# Patient Record
Sex: Male | Born: 1965 | Race: Black or African American | Hispanic: No | Marital: Single | Smoking: Current every day smoker
Health system: Southern US, Community
[De-identification: ages and names within clinical notes are randomized; demographics above are authoritative.]

## PROBLEM LIST (undated history)

## (undated) DIAGNOSIS — F1410 Cocaine abuse, uncomplicated: Secondary | ICD-10-CM

---

## 2005-07-17 ENCOUNTER — Emergency Department (HOSPITAL_COMMUNITY): Admission: EM | Admit: 2005-07-17 | Discharge: 2005-07-18 | Payer: Self-pay | Admitting: Emergency Medicine

## 2006-05-06 ENCOUNTER — Ambulatory Visit: Payer: Self-pay | Admitting: Oncology

## 2008-06-29 ENCOUNTER — Emergency Department (HOSPITAL_COMMUNITY): Admission: EM | Admit: 2008-06-29 | Discharge: 2008-06-29 | Payer: Self-pay | Admitting: Family Medicine

## 2009-02-04 ENCOUNTER — Emergency Department (HOSPITAL_COMMUNITY): Admission: EM | Admit: 2009-02-04 | Discharge: 2009-02-04 | Payer: Self-pay | Admitting: Emergency Medicine

## 2011-11-28 ENCOUNTER — Encounter (HOSPITAL_COMMUNITY): Payer: Self-pay | Admitting: *Deleted

## 2011-11-28 ENCOUNTER — Emergency Department (INDEPENDENT_AMBULATORY_CARE_PROVIDER_SITE_OTHER)
Admission: EM | Admit: 2011-11-28 | Discharge: 2011-11-28 | Disposition: A | Discharge status: Home / Self Care | Payer: Self-pay | Source: Home / Self Care | Attending: Emergency Medicine | Admitting: Emergency Medicine

## 2011-11-28 DIAGNOSIS — J069 Acute upper respiratory infection, unspecified: Secondary | ICD-10-CM

## 2011-11-28 DIAGNOSIS — R05 Cough: Secondary | ICD-10-CM

## 2011-11-28 MED ORDER — GUAIFENESIN-CODEINE 100-10 MG/5ML PO SYRP: 5.0000 mL | ORAL_SOLUTION | Freq: Three times a day (TID) | ORAL | Status: AC | PRN

## 2011-11-28 MED ORDER — PREDNISONE 20 MG PO TABS: 40.0000 mg | ORAL_TABLET | Freq: Every day | ORAL | Status: AC

## 2011-11-28 NOTE — ED Provider Notes (Signed)
History     CSN: [id number]  Arrival date & time 11/28/11  1150   First MD Initiated Contact with Patient 11/28/11 1310      Chief Complaint  Patient presents with  . Cough    (Consider location/radiation/quality/duration/timing/severity/associated sxs/prior treatment) HPI Comments: Patient presents urgent care complaining of an ongoing cough and most recently with upper congestion describes as in the runny nose, stuffy nose and cough with phlegm. Patient has been using NyQuil and DayQuil as to control upper congestion with mild improvement. He does continue to cough and has not been able to go to work for the last 2 days also requesting a work note. Patient denies any wheezing or shortness of breath. Believes that the above 1-2 weeks ago he had a cold for fever and chills have not had any fevers and was 48 hours.  Patient is a 46 y.o. male presenting with cough.  Cough This is a recurrent problem. The current episode started more than 1 week ago. The problem occurs every few minutes. The problem has not changed since onset.The cough is productive of sputum. There has been no fever. Associated symptoms include chills, ear congestion and rhinorrhea. Pertinent negatives include no chest pain and no wheezing. He has tried nothing for the symptoms. His past medical history does not include emphysema or asthma.    History reviewed. No pertinent past medical history.  History reviewed. No pertinent past surgical history.  Family History  Problem Relation Age of Onset  . Stroke Mother   . Hypertension Father     History  Substance Use Topics  . Smoking status: Current Everyday Smoker -- 15 years    Types: Cigarettes  . Smokeless tobacco: Not on file  . Alcohol Use: No      Review of Systems  Constitutional: Positive for fever, chills and appetite change.  HENT: Positive for congestion and rhinorrhea.   Respiratory: Positive for cough. Negative for chest tightness and wheezing.     Cardiovascular: Negative for chest pain.    Allergies  Review of patient's allergies indicates no known allergies.  Home Medications   Current Outpatient Rx  Name Route Sig Dispense Refill  . GUAIFENESIN-CODEINE 100-10 MG/5ML PO SYRP Oral Take 5 mLs by mouth 3 (three) times daily as needed for cough. 120 mL 0  . PREDNISONE 20 MG PO TABS Oral Take 2 tablets (40 mg total) by mouth daily. 2 tablets daily for 5 days 10 tablet 0    BP 121/74  Pulse 88  Temp(Src) 98.9 F (37.2 C) (Oral)  Resp 12  SpO2 100%  Physical Exam  Nursing note and vitals reviewed. Constitutional: He appears well-developed and well-nourished.  Non-toxic appearance. He does not have a sickly appearance. He does not appear ill. No distress.  HENT:  Head: Normocephalic.  Right Ear: Tympanic membrane normal.  Left Ear: Tympanic membrane normal.  Nose: Nose normal.  Mouth/Throat: Uvula is midline and mucous membranes are normal.  Eyes: Conjunctivae are normal. Right eye exhibits no discharge. Left eye exhibits no discharge.  Neck: Neck supple. No JVD present.  Cardiovascular: Normal rate.   Pulmonary/Chest: Effort normal and breath sounds normal. No respiratory distress. He has no decreased breath sounds. He has no wheezes. He has no rhonchi. He has no rales.  Abdominal: He exhibits no distension. There is no tenderness.  Lymphadenopathy:    He has no cervical adenopathy.  Skin: No erythema.    ED Course  Procedures (including critical care time)  Labs  Reviewed - No data to display No results found.   1. Upper respiratory infection   2. Cough       MDM  Patient presents with somewhat recurrent three-week cough. Normal respiratory exam normal vital signs patient looks comfortable describes that within the last 2-3 days as well he has been having upper congestion runny nose. He thinks he had a cold couple weeks ago patient is comfortable not dyspneic symptomatic treatment encourage her for the next  5-7 days if no improvement to return for further evaluation        Jimmie Molly, MD 11/28/11 1524

## 2011-11-28 NOTE — ED Notes (Signed)
Cold Sxs the past 3 weeks.  Today he c/o recent fever fever/chills and productive cough.   He has tried nyquil without much relief

## 2011-11-28 NOTE — Discharge Instructions (Signed)
  If your symptoms persist beyond 2 weeks despite this medicines specifically her cough should return for recheck and perhaps x-rays   Cough, Adult  A cough is a reflex that helps clear your throat and airways. It can help heal the body or may be a reaction to an irritated airway. A cough may only last 2 or 3 weeks (acute) or may last more than 8 weeks (chronic).  CAUSES Acute cough:  Viral or bacterial infections.  Chronic cough:  Infections.   Allergies.   Asthma.   Post-nasal drip.   Smoking.   Heartburn or acid reflux.   Some medicines.   Chronic lung problems (COPD).   Cancer.  SYMPTOMS   Cough.   Fever.   Chest pain.   Increased breathing rate.   High-pitched whistling sound when breathing (wheezing).   Colored mucus that you cough up (sputum).  TREATMENT   A bacterial cough may be treated with antibiotic medicine.   A viral cough must run its course and will not respond to antibiotics.   Your caregiver may recommend other treatments if you have a chronic cough.  HOME CARE INSTRUCTIONS   Only take over-the-counter or prescription medicines for pain, discomfort, or fever as directed by your caregiver. Use cough suppressants only as directed by your caregiver.   Use a cold steam vaporizer or humidifier in your bedroom or home to help loosen secretions.   Sleep in a semi-upright position if your cough is worse at night.   Rest as needed.   Stop smoking if you smoke.  SEEK IMMEDIATE MEDICAL CARE IF:   You have pus in your sputum.   Your cough starts to worsen.   You cannot control your cough with suppressants and are losing sleep.   You begin coughing up blood.   You have difficulty breathing.   You develop pain which is getting worse or is uncontrolled with medicine.   You have a fever.  MAKE SURE YOU:   Understand these instructions.   Will watch your condition.   Will get help right away if you are not doing well or get worse.    Document Released: 02/03/2011 Document Revised: 07/27/2011 Document Reviewed: 02/03/2011 [facility] Patient Information 2012 [place], [place].

## 2012-05-21 ENCOUNTER — Encounter (HOSPITAL_COMMUNITY): Payer: Self-pay | Admitting: Emergency Medicine

## 2012-05-21 ENCOUNTER — Emergency Department (HOSPITAL_COMMUNITY)
Admission: EM | Admit: 2012-05-21 | Discharge: 2012-05-21 | Disposition: A | Discharge status: Home / Self Care | Payer: Self-pay | Source: Home / Self Care

## 2012-05-21 ENCOUNTER — Emergency Department (HOSPITAL_COMMUNITY)
Admission: EM | Admit: 2012-05-21 | Discharge: 2012-05-21 | Disposition: A | Discharge status: Home / Self Care | Payer: Self-pay | Attending: Emergency Medicine | Admitting: Emergency Medicine

## 2012-05-21 DIAGNOSIS — W57XXXA Bitten or stung by nonvenomous insect and other nonvenomous arthropods, initial encounter: Secondary | ICD-10-CM | POA: Insufficient documentation

## 2012-05-21 DIAGNOSIS — T148 Other injury of unspecified body region: Secondary | ICD-10-CM | POA: Insufficient documentation

## 2012-05-21 DIAGNOSIS — F17200 Nicotine dependence, unspecified, uncomplicated: Secondary | ICD-10-CM | POA: Insufficient documentation

## 2012-05-21 MED ORDER — HYDROCORTISONE 1 % EX CREA: TOPICAL_CREAM | CUTANEOUS | Status: DC

## 2012-05-21 MED ORDER — IBUPROFEN 200 MG PO TABS
600.0000 mg | ORAL_TABLET | Freq: Once | ORAL | Status: DC
  Filled 2012-05-21: qty 1

## 2012-05-21 MED ORDER — DIPHENHYDRAMINE HCL 25 MG PO CAPS
25.0000 mg | ORAL_CAPSULE | Freq: Once | ORAL | Status: AC
  Administered 2012-05-21: 25 mg via ORAL
  Filled 2012-05-21: qty 1

## 2012-05-21 NOTE — ED Provider Notes (Signed)
History  Scribed for Loren Racer, MD, the patient was seen in room TR04C/TR04C. This chart was scribed by Candelaria Stagers. The patient's care started at 2:27 PM   CSN: [id number]  Arrival date & time 05/21/12  1330   First MD Initiated Contact with Patient 05/21/12 1342      Chief Complaint  Patient presents with  . Rash     The history is provided by the patient. No language interpreter was used.   Thomas Reeves is a 46 y.o. male who presents to the Emergency Department complaining of an itching rash to back, legs, and back that started about two weeks ago.  He states that the spots heal and then he notices new spots.  He reports that his wife does not have the same rash.  Nothing seems to make the sx better or worse.     History reviewed. No pertinent past medical history.  History reviewed. No pertinent past surgical history.  Family History  Problem Relation Age of Onset  . Stroke Mother   . Hypertension Father     History  Substance Use Topics  . Smoking status: Current Every Day Smoker -- 15 years    Types: Cigarettes  . Smokeless tobacco: Not on file  . Alcohol Use: No      Review of Systems  Skin: Positive for rash.  All other systems reviewed and are negative.    Allergies  Review of patient's allergies indicates no known allergies.  Home Medications   Current Outpatient Rx  Name Route Sig Dispense Refill  . HYDROCORTISONE 1 % EX CREA  Apply to affected area 2 times daily 15 g 0    BP 138/95  Pulse 64  Temp 98.6 F (37 C)  Resp 16  Physical Exam  Nursing note and vitals reviewed. Constitutional: He is oriented to person, place, and time. He appears well-developed and well-nourished. No distress.  HENT:  Head: Normocephalic and atraumatic.  Eyes: EOM are normal. Pupils are equal, round, and reactive to light.  Neck: Neck supple. No tracheal deviation present.  Pulmonary/Chest: Effort normal. No respiratory distress.  Musculoskeletal:  Normal range of motion. He exhibits no edema.  Neurological: He is alert and oriented to person, place, and time.  Skin: Skin is warm and dry.       Erythematous raised papules covering upper back and neck, none on the lower back.      Psychiatric: He has a normal mood and affect. His behavior is normal.    ED Course  Procedures   DIAGNOSTIC STUDIES:   COORDINATION OF CARE:     Labs Reviewed - No data to display No results found.   1. Insect bites       MDM  I personally performed the services described in this documentation, which was scribed in my presence. The recorded information has been reviewed and considered.        Loren Racer, MD 05/21/12 [phone]

## 2012-05-21 NOTE — ED Notes (Signed)
Rash on arms  , back and legs x 2 weeks

## 2014-06-09 ENCOUNTER — Encounter (HOSPITAL_COMMUNITY): Payer: Self-pay | Admitting: Emergency Medicine

## 2014-06-09 ENCOUNTER — Emergency Department (HOSPITAL_COMMUNITY)
Admission: EM | Admit: 2014-06-09 | Discharge: 2014-06-09 | Discharge status: Against Medical Advice | Payer: Self-pay | Attending: Emergency Medicine | Admitting: Emergency Medicine

## 2014-06-09 DIAGNOSIS — R55 Syncope and collapse: Secondary | ICD-10-CM | POA: Insufficient documentation

## 2014-06-09 DIAGNOSIS — Z720 Tobacco use: Secondary | ICD-10-CM | POA: Insufficient documentation

## 2014-06-09 DIAGNOSIS — R42 Dizziness and giddiness: Secondary | ICD-10-CM | POA: Insufficient documentation

## 2014-06-09 LAB — BASIC METABOLIC PANEL
Anion gap: 11 (ref 5–15)
BUN: 13 mg/dL (ref 6–23)
CHLORIDE: 107 meq/L (ref 96–112)
CO2: 27 mEq/L (ref 19–32)
Calcium: 9.3 mg/dL (ref 8.4–10.5)
Creatinine, Ser: 1.39 mg/dL — ABNORMAL HIGH (ref 0.50–1.35)
GFR calc non Af Amer: 59 mL/min — ABNORMAL LOW (ref >90)
GFR, EST AFRICAN AMERICAN: 68 mL/min — AB (ref >90)
GLUCOSE: 114 mg/dL — AB (ref 70–99)
POTASSIUM: 3.8 meq/L (ref 3.7–5.3)
SODIUM: 145 meq/L (ref 137–147)

## 2014-06-09 LAB — CBC
HCT: 41.7 % (ref 39.0–52.0)
HEMOGLOBIN: 14.3 g/dL (ref 13.0–17.0)
MCH: 30.4 pg (ref 26.0–34.0)
MCHC: 34.3 g/dL (ref 30.0–36.0)
MCV: 88.7 fL (ref 78.0–100.0)
Platelets: UNDETERMINED 10*3/uL (ref 150–400)
RBC: 4.7 MIL/uL (ref 4.22–5.81)
RDW: 13.1 % (ref 11.5–15.5)
WBC: 5.8 10*3/uL (ref 4.0–10.5)

## 2014-06-09 NOTE — ED Notes (Signed)
Pt reports intermittent dizziness "room spinning" x 3 days. Reports episodes last a couple of hours. Denies any syncopal episodes. Denies anything makes better or worse. Denies CP. Denies neuro symptoms. Pt  Denies dizziness at this time. Denies pain. Pt ambulatory to triage. Neuro intact.

## 2015-03-03 ENCOUNTER — Encounter (HOSPITAL_COMMUNITY): Payer: Self-pay | Admitting: *Deleted

## 2015-03-03 ENCOUNTER — Emergency Department (HOSPITAL_COMMUNITY)
Admission: EM | Admit: 2015-03-03 | Discharge: 2015-03-03 | Disposition: A | Discharge status: Home / Self Care | Payer: Self-pay | Attending: Emergency Medicine | Admitting: Emergency Medicine

## 2015-03-03 DIAGNOSIS — H6091 Unspecified otitis externa, right ear: Secondary | ICD-10-CM | POA: Insufficient documentation

## 2015-03-03 DIAGNOSIS — Z79899 Other long term (current) drug therapy: Secondary | ICD-10-CM | POA: Insufficient documentation

## 2015-03-03 DIAGNOSIS — Z720 Tobacco use: Secondary | ICD-10-CM | POA: Insufficient documentation

## 2015-03-03 DIAGNOSIS — J029 Acute pharyngitis, unspecified: Secondary | ICD-10-CM | POA: Insufficient documentation

## 2015-03-03 DIAGNOSIS — H6121 Impacted cerumen, right ear: Secondary | ICD-10-CM | POA: Insufficient documentation

## 2015-03-03 MED ORDER — CIPROFLOXACIN-DEXAMETHASONE 0.3-0.1 % OT SUSP
4.0000 [drp] | Freq: Once | OTIC | Status: AC
  Administered 2015-03-03: 4 [drp] via OTIC
  Filled 2015-03-03: qty 7.5

## 2015-03-03 MED ORDER — CEPHALEXIN 500 MG PO CAPS: 500.0000 mg | ORAL_CAPSULE | Freq: Four times a day (QID) | ORAL | Status: DC

## 2015-03-03 MED ORDER — TRIAMCINOLONE ACETONIDE 0.1 % EX CREA: 1.0000 "application " | TOPICAL_CREAM | Freq: Two times a day (BID) | CUTANEOUS | Status: DC

## 2015-03-03 NOTE — ED Notes (Signed)
PT  Reports Rt ear pain started on Tuesday.

## 2015-03-03 NOTE — Discharge Instructions (Signed)
°  Use half peroxide half warm water to irrigate your ear. You can also use over the counter ear wax removal kits.  ciprodex drops twice a day for 7 days. Follow up with ENT if not improving.   Cerumen Impaction A cerumen impaction is when the wax in your ear forms a plug. This plug usually causes reduced hearing. Sometimes it also causes an earache or dizziness. Removing a cerumen impaction can be difficult and painful. The wax sticks to the ear canal. The canal is sensitive and bleeds easily. If you try to remove a heavy wax buildup with a cotton tipped swab, you may push it in further. Irrigation with water, suction, and small ear curettes may be used to clear out the wax. If the impaction is fixed to the skin in the ear canal, ear drops may be needed for a few days to loosen the wax. People who build up a lot of wax frequently can use ear wax removal products available in your local drugstore. SEEK MEDICAL CARE IF:  You develop an earache, increased hearing loss, or marked dizziness. Document Released: 09/14/2004 Document Revised: 10/30/2011 Document Reviewed: 11/04/2009 [facility] Patient Information 2015 [place], [place]. This information is not intended to replace advice given to you by your health care provider. Make sure you discuss any questions you have with your health care provider.

## 2015-03-03 NOTE — ED Notes (Signed)
Spoke with Thomas Reeves in pharmacy.   Medication on its way now.

## 2015-03-03 NOTE — ED Provider Notes (Signed)
CSN: [id number]     Arrival date & time 03/03/15  [id number] History  This chart was scribed for non-physician practitioner, Lottie Musselatyana A Mercer Stallworth, PA-C, working with Purvis SheffieldForrest Harrison, MD by Charline BillsEssence Howell, ED Scribe. This patient was seen in room TR07C/TR07C and the patient's care was started at 9:19 AM.   Chief Complaint  Patient presents with  . Otalgia   The history is provided by the patient. No language interpreter was used.   HPI Comments: Thomas Reeves is a 49 y.o. male who presents to the Emergency Department complaining of persistent right ear pain onset yesterday. Pt reports associated difficulty hearing out of his right ear and mild sore throat. He has tried using Q-tips without relief. Pt denies ear discharge, rhinorrhea, fever, cough.   History reviewed. No pertinent past medical history. History reviewed. No pertinent past surgical history. Family History  Problem Relation Age of Onset  . Stroke Mother   . Hypertension Father    History  Substance Use Topics  . Smoking status: Current Every Day Smoker -- 0.50 packs/day for 15 years    Types: Cigarettes  . Smokeless tobacco: Never Used  . Alcohol Use: No    Review of Systems  Constitutional: Negative for fever.  HENT: Positive for ear pain and sore throat. Negative for rhinorrhea.    Allergies  Review of patient's allergies indicates no known allergies.  Home Medications   Prior to Admission medications   Medication Sig Start Date End Date Taking? Authorizing Provider  cephALEXin (KEFLEX) 500 MG capsule Take 1 capsule (500 mg total) by mouth 4 (four) times daily. 03/03/15   Zamyah Wiesman, PA-C  hydrocortisone cream 1 % Apply to affected area 2 times daily 05/21/12   Loren Raceravid Yelverton, MD  triamcinolone cream (KENALOG) 0.1 % Apply 1 application topically 2 (two) times daily. 03/03/15   Nathin Saran, PA-C   BP 127/81 mmHg  Pulse 64  Temp(Src) 97.8 F (36.6 C) (Oral)  Resp 18  Ht 5\' 2"  (1.575 m)  Wt 136 lb 4.8  oz (61.825 kg)  BMI 24.92 kg/m2  SpO2 100% Physical Exam  Constitutional: He is oriented to person, place, and time. He appears well-developed and well-nourished. No distress.  HENT:  Head: Normocephalic and atraumatic.  Left ear, ear canal, TM normal. Right external ear normal, ear canal appears to be irritated erythematous. Cerumen impaction present.  Eyes: Conjunctivae and EOM are normal.  Neck: Neck supple. No tracheal deviation present.  Cardiovascular: Normal rate.   Pulmonary/Chest: Effort normal. No respiratory distress.  Musculoskeletal: Normal range of motion.  Neurological: He is alert and oriented to person, place, and time.  Skin: Skin is warm and dry.  Psychiatric: He has a normal mood and affect. His behavior is normal.  Nursing note and vitals reviewed.  ED Course  Procedures (including critical care time)  DIAGNOSTIC STUDIES: Oxygen Saturation is 100% on RA, normal by my interpretation.    COORDINATION OF CARE: 9:21 AM-Discussed treatment plan which includes ear irrigation with pt at bedside and pt agreed to plan.   Labs Review Labs Reviewed - No data to display  Imaging Review No results found.   EKG Interpretation None      MDM   Final diagnoses:  Cerumen impaction, right  Otitis externa, right    patient with right ear cerumen impaction, irrigated and large cerumen out of the ear canal. Patient continues to have cerumen impacted all the way near the TM. Tried to irrigate and retrieved cerumen with  ear curette, unable to do so. Canal is erythematous, with mild swelling. Will start on Ciprodex and instructed to irrigate the ear at home. Follow up with ENT or PCP as needed.   Filed Vitals:   03/03/15 0835 03/03/15 1126 03/03/15 1309  BP: 127/81 161/91 138/93  Pulse: 64 53 60  Temp: 97.8 F (36.6 C) 97.8 F (36.6 C) 97.3 F (36.3 C)  TempSrc: Oral Oral Oral  Resp: Height:  (1.575 m)    Weight: 136 lb 4.8 oz (61.825 kg)    SpO2:  100% 100% 97%    I personally performed the services described in this documentation, which was scribed in my presence. The recorded information has been reviewed and is accurate.    Jaynie Crumble, PA-C 03/03/15 1651  Purvis Sheffield, MD 03/04/15 [phone]

## 2018-08-07 ENCOUNTER — Emergency Department (HOSPITAL_COMMUNITY)
Admission: EM | Admit: 2018-08-07 | Discharge: 2018-08-07 | Disposition: A | Discharge status: Home / Self Care | Payer: Self-pay | Attending: Emergency Medicine | Admitting: Emergency Medicine

## 2018-08-07 ENCOUNTER — Other Ambulatory Visit: Payer: Self-pay

## 2018-08-07 ENCOUNTER — Emergency Department (HOSPITAL_COMMUNITY): Payer: Self-pay

## 2018-08-07 ENCOUNTER — Encounter (HOSPITAL_COMMUNITY): Payer: Self-pay

## 2018-08-07 DIAGNOSIS — F17210 Nicotine dependence, cigarettes, uncomplicated: Secondary | ICD-10-CM | POA: Insufficient documentation

## 2018-08-07 DIAGNOSIS — M5416 Radiculopathy, lumbar region: Secondary | ICD-10-CM | POA: Insufficient documentation

## 2018-08-07 DIAGNOSIS — Z79899 Other long term (current) drug therapy: Secondary | ICD-10-CM | POA: Insufficient documentation

## 2018-08-07 DIAGNOSIS — W010XXA Fall on same level from slipping, tripping and stumbling without subsequent striking against object, initial encounter: Secondary | ICD-10-CM | POA: Insufficient documentation

## 2018-08-07 MED ORDER — CYCLOBENZAPRINE HCL 10 MG PO TABS: 10.0000 mg | ORAL_TABLET | Freq: Two times a day (BID) | ORAL | 0 refills | Status: DC | PRN

## 2018-08-07 MED ORDER — CYCLOBENZAPRINE HCL 10 MG PO TABS
10.0000 mg | ORAL_TABLET | Freq: Once | ORAL | Status: AC
  Administered 2018-08-07: 10 mg via ORAL
  Filled 2018-08-07: qty 1

## 2018-08-07 MED ORDER — KETOROLAC TROMETHAMINE 60 MG/2ML IM SOLN
30.0000 mg | Freq: Once | INTRAMUSCULAR | Status: AC
  Administered 2018-08-07: 30 mg via INTRAMUSCULAR
  Filled 2018-08-07: qty 2

## 2018-08-07 MED ORDER — PREDNISONE 10 MG (21) PO TBPK: ORAL_TABLET | Freq: Every day | ORAL | 0 refills | Status: DC

## 2018-08-07 MED ORDER — PREDNISONE 20 MG PO TABS
60.0000 mg | ORAL_TABLET | Freq: Once | ORAL | Status: AC
  Administered 2018-08-07: 60 mg via ORAL
  Filled 2018-08-07: qty 3

## 2018-08-07 NOTE — Discharge Instructions (Addendum)
Thank you for allowing me to care for you today in the Emergency Department.   Starting tomorrow, take 6 tabs of prednisone by mouth daily  for 2 days, then 5 tabs for 2 days, then 4 tabs for 2 days, then 3 tabs for 2 days, 2 tabs for 2 days, then 1 tab by mouth daily for 2 days.  You can take 600 mg of ibuprofen with food or 650 mg of Tylenol once every 6 hours for pain control.  Apply an ice pack for 15 to 20 minutes as frequently as needed.  You can also take 1 tablet of Flexeril up to 2 times daily.  Do not drink alcohol or use other medications that make you sleepy until you know how this medication affects you.  You should not work or drive with this medication until you know how it impacts you.  Start using the attached stretches as your muscles allow to improve your pain.  Please note, even with this regimen it may take several days or up to 2 weeks for your pain to significantly improve.  Call the number on your discharge paperwork to get established with a primary care provider for follow-up.  Return to the emergency department if you start having episodes where you pee or poop on yourself, develop a high fever, blood in your urine, or other new, concerning symptoms.

## 2018-08-07 NOTE — ED Provider Notes (Signed)
[facility]-EMERGENCY DEPT Provider Note   CSN: [id number] Arrival date & time: 08/07/18  1332     History   Chief Complaint Chief Complaint  Patient presents with  . Hip Pain  . Leg Pain    HPI Thomas Reeves is a 52 y.o. male with no pertinent past medical history who presents to the emergency department with a chief complaint of right leg pain.  The patient endorses sharp, "burning" pain that starts in his low back and radiates down his right buttock, thigh, and to the top of the right lower leg that has been intermittently present "for awhile". He states that over the last few days that the pain has significantly worsened.  He states that he has been unable to put any weight on his right buttock while sitting due to the pain so he has been shifting his weight onto his left hip.  He states that he can walk, but it is painful.  He reports that earlier today his right leg gave out on him due to the pain and caused him to fall.  States he fell on his right leg with the lower part of his leg underneath him.  He denies hitting his head, LOC, nausea, or vomiting.  He reports that he was able to get up without assistance.   He reports that his job requires him to stand for long periods of time.  No previous back injuries or surgeries.  He denies numbness or weakness, left leg pain, mid back pain, neck pain, abdominal pain, dysuria, hematuria, constipation, or diarrhea. No treatment prior to arrival.  The history is provided by the patient. No language interpreter was used.    History reviewed. No pertinent past medical history.  There are no active problems to display for this patient.   History reviewed. No pertinent surgical history.      Home Medications    Prior to Admission medications   Medication Sig Start Date End Date Taking? Authorizing Provider  cyclobenzaprine (FLEXERIL) 10 MG tablet Take 1 tablet (10 mg total) by mouth 2 (two) times daily as  needed for muscle spasms. 08/07/18   Ark Agrusa A, PA-C  hydrocortisone cream 1 % Apply to affected area 2 times daily 05/21/12   Loren RacerYelverton, David, MD  predniSONE (STERAPRED UNI-PAK 21 TAB) 10 MG (21) TBPK tablet Take by mouth daily. Take 6 tabs by mouth daily  for 2 days, then 5 tabs for 2 days, then 4 tabs for 2 days, then 3 tabs for 2 days, 2 tabs for 2 days, then 1 tab by mouth daily for 2 days 08/07/18   Barkley BoardsMcDonald, Nefertiti Mohamad A, PA-C    Family History Family History  Problem Relation Age of Onset  . Stroke Mother   . Hypertension Father     Social History Social History   Tobacco Use  . Smoking status: Current Every Day Smoker    Packs/day: 0.50    Years: 15.00    Pack years: 7.50    Types: Cigarettes  . Smokeless tobacco: Never Used  Substance Use Topics  . Alcohol use: No    Frequency: Never  . Drug use: Yes    Frequency: 7.0 times per week    Types: Marijuana     Allergies   Patient has no known allergies.   Review of Systems Review of Systems  Constitutional: Negative for activity change, chills and fever.  Respiratory: Negative for shortness of breath.   Cardiovascular: Negative for chest  pain.  Gastrointestinal: Negative for abdominal pain.  Musculoskeletal: Positive for arthralgias, back pain, gait problem and myalgias. Negative for joint swelling, neck pain and neck stiffness.  Skin: Negative for color change, rash and wound.  Neurological: Negative for weakness and numbness.   Physical Exam Updated Vital Signs BP (!) 129/94   Pulse 63   Temp 97.9 F (36.6 C) (Oral)   Resp 20   Ht 5\' 2"  (1.575 m)   Wt 63.5 kg   SpO2 99%   BMI 25.61 kg/m   Physical Exam Vitals signs and nursing note reviewed.  Constitutional:      Appearance: He is well-developed.  HENT:     Head: Normocephalic.  Eyes:     Conjunctiva/sclera: Conjunctivae normal.  Neck:     Musculoskeletal: Neck supple.  Cardiovascular:     Rate and Rhythm: Normal rate and regular rhythm.      Heart sounds: No murmur.  Pulmonary:     Effort: Pulmonary effort is normal. No respiratory distress.     Breath sounds: No stridor. No wheezing or rhonchi.  Abdominal:     General: There is no distension.     Palpations: Abdomen is soft. There is no mass.     Tenderness: There is no abdominal tenderness. There is no guarding or rebound.     Hernia: No hernia is present.  Musculoskeletal:        General: Tenderness present.     Comments: No tenderness to the cervical or thoracic spinous processes or bilateral paraspinal muscles.  Point tenderness around L4-L5 to the spinous processes with right-sided paraspinal muscle tenderness.  No left-sided tenderness.  He is also tender to palpation over the right SI joint.  No left SI joint tenderness.  No focal tenderness to the right hip, knee, or ankle.  DP pulses are 2+ and symmetric.  No lower extremity swelling bilaterally.  Sensation is intact and equal throughout.  Antalgic gait.   Skin:    General: Skin is warm and dry.  Neurological:     Mental Status: He is alert.  Psychiatric:        Behavior: Behavior normal.      ED Treatments / Results  Labs (all labs ordered are listed, but only abnormal results are displayed) Labs Reviewed - No data to display  EKG None  Radiology Dg Lumbar Spine Complete  Result Date: 08/07/2018 CLINICAL DATA:  Low back pain radiating down right leg. EXAM: LUMBAR SPINE - COMPLETE 4+ VIEW COMPARISON:  06/29/2008 FINDINGS: Normal alignment of the lumbar spine. Negative for a pars defect. Vertebral body heights are maintained. Sclerosis and endplate changes along the superior endplate of L4 are likely degenerative. Mild disc space narrowing at L5-S1. The other disc spaces are maintained. IMPRESSION: No acute abnormality.  Mild degenerative disease as described. Electronically Signed   By: Richarda Overlie M.D.   On: 08/07/2018 17:37    Procedures Procedures (including critical care time)  Medications Ordered in  ED Medications  cyclobenzaprine (FLEXERIL) tablet 10 mg (10 mg Oral Given 08/07/18 1526)  ketorolac (TORADOL) injection 30 mg (30 mg Intramuscular Given 08/07/18 1526)  predniSONE (DELTASONE) tablet 60 mg (60 mg Oral Given 08/07/18 1526)     Initial Impression / Assessment and Plan / ED Course  I have reviewed the triage vital signs and the nursing notes.  Pertinent labs & imaging results that were available during my care of the patient were reviewed by me and considered in my medical decision making (  see chart for details).     52-year-male with no pertinent medical history who presents to the emergency department with low back pain that radiates down the right leg.  He had a fall earlier today due to the pain.  Lumbar spine x-ray with sclerosis and endplate changes along L4 and mild to space narrowing at L5-S1.  No other acute findings.  Treated in the ED with prednisone, Toradol, and Flexeril.  On reexamination, the patient is able to sit equally on his bilateral hips.  He is ambulatory independently.  Will discharge home with a course of prednisone and Flexeril for lumbar radiculopathy.  Doubt cauda equina, myositis, or discitis.  The patient can also use ibuprofen and Tylenol.  Encourage the patient to get established with a primary care provider.  Strict return precautions given.  He is hemodynamically stable and in no acute distress.  He is safe discharge home with outpatient follow-up at this time.  Final Clinical Impressions(s) / ED Diagnoses   Final diagnoses:  Lumbar radiculopathy    ED Discharge Orders         Ordered    cyclobenzaprine (FLEXERIL) 10 MG tablet  2 times daily PRN     08/07/18 1802    predniSONE (STERAPRED UNI-PAK 21 TAB) 10 MG (21) TBPK tablet  Daily     08/07/18 1802           Barkley Boards, PA-C 08/07/18 Leonette Nutting, MD 08/07/18 2009

## 2018-08-07 NOTE — ED Triage Notes (Signed)
Pt reports that he has had right sided hip pain "for a while" due to "staying on his feet a lot" Pt reports that today his "right leg gave out" causing him to fall. Pt denies any other injury.

## 2019-04-02 ENCOUNTER — Other Ambulatory Visit: Payer: Self-pay

## 2019-04-02 ENCOUNTER — Encounter (HOSPITAL_COMMUNITY): Payer: Self-pay | Admitting: Emergency Medicine

## 2019-04-02 ENCOUNTER — Emergency Department (HOSPITAL_COMMUNITY)
Admission: EM | Admit: 2019-04-02 | Discharge: 2019-04-02 | Disposition: A | Discharge status: Home / Self Care | Payer: Self-pay | Attending: Emergency Medicine | Admitting: Emergency Medicine

## 2019-04-02 DIAGNOSIS — Y990 Civilian activity done for income or pay: Secondary | ICD-10-CM | POA: Insufficient documentation

## 2019-04-02 DIAGNOSIS — F17210 Nicotine dependence, cigarettes, uncomplicated: Secondary | ICD-10-CM | POA: Insufficient documentation

## 2019-04-02 DIAGNOSIS — Y929 Unspecified place or not applicable: Secondary | ICD-10-CM | POA: Insufficient documentation

## 2019-04-02 DIAGNOSIS — Y999 Unspecified external cause status: Secondary | ICD-10-CM | POA: Insufficient documentation

## 2019-04-02 DIAGNOSIS — Y9301 Activity, walking, marching and hiking: Secondary | ICD-10-CM | POA: Insufficient documentation

## 2019-04-02 DIAGNOSIS — S39012A Strain of muscle, fascia and tendon of lower back, initial encounter: Secondary | ICD-10-CM | POA: Insufficient documentation

## 2019-04-02 DIAGNOSIS — X503XXA Overexertion from repetitive movements, initial encounter: Secondary | ICD-10-CM | POA: Insufficient documentation

## 2019-04-02 MED ORDER — KETOROLAC TROMETHAMINE 30 MG/ML IJ SOLN
30.0000 mg | Freq: Once | INTRAMUSCULAR | Status: AC
  Administered 2019-04-02: 30 mg via INTRAMUSCULAR
  Filled 2019-04-02: qty 1

## 2019-04-02 MED ORDER — PREDNISONE 10 MG (21) PO TBPK: ORAL_TABLET | Freq: Every day | ORAL | 0 refills | Status: DC

## 2019-04-02 MED ORDER — ACETAMINOPHEN 325 MG PO TABS
650.0000 mg | ORAL_TABLET | Freq: Once | ORAL | Status: AC
  Administered 2019-04-02: 11:00:00 650 mg via ORAL
  Filled 2019-04-02: qty 2

## 2019-04-02 MED ORDER — LIDOCAINE 5 % EX PTCH
1.0000 | MEDICATED_PATCH | CUTANEOUS | Status: DC
  Administered 2019-04-02: 1 via TRANSDERMAL
  Filled 2019-04-02: qty 1

## 2019-04-02 NOTE — ED Triage Notes (Signed)
Pt c/o right hip and back pains for couple months. Denies trauma or injuries.

## 2019-04-02 NOTE — Discharge Instructions (Signed)
It is important for you to establish care with a primary care provider for worsening symptoms. Return to the ED if you start to have worsening symptoms including developing a fever, numbness in your legs, losing control of your bowels or bladder, chest pain or shortness of breath.

## 2019-04-02 NOTE — ED Provider Notes (Signed)
[facility]   CSN: [id number] Arrival date & time: 04/02/19  [id number]    History   Chief Complaint Chief Complaint  Patient presents with   Hip Pain   Back Pain    HPI Thomas Reeves is a 53 y.o. male who presents to ED for complaints of lower back pain.  Describes the pain as aching, worse with movement and palpation.  Notes history of similar symptoms in the past when he was here approximately 9 months ago.  He cannot recall any inciting event that may have triggered the symptoms but states that "I walk around a lot at work so that could be it."  He has tried medications that he was given when he presented to the ED 9 months ago but he is unable to recall what medications they are.  He denies any dysuria, hematuria, history of kidney stones, shortness of breath, chest pain, prior back surgeries, history of cancer, she of IV drug use, fever, numbness in arms or legs.     HPI  History reviewed. No pertinent past medical history.  There are no active problems to display for this patient.   History reviewed. No pertinent surgical history.      Home Medications    Prior to Admission medications   Medication Sig Start Date End Date Taking? Authorizing Provider  cyclobenzaprine (FLEXERIL) 10 MG tablet Take 1 tablet (10 mg total) by mouth 2 (two) times daily as needed for muscle spasms. 08/07/18   McDonald, Mia A, PA-C  hydrocortisone cream 1 % Apply to affected area 2 times daily 05/21/12   Loren RacerYelverton, David, MD  predniSONE (STERAPRED UNI-PAK 21 TAB) 10 MG (21) TBPK tablet Take by mouth daily. 6 tabs by mouth daily  for 2 day, then 5 tabs for 2 day, then 4 tabs for 2 day, then 3 tabs for 2 day, 2 tabs for 2 day, then 1 tab by mouth daily for 2 day 04/02/19   Dietrich PatesKhatri, Ryleeann Urquiza, PA-C    Family History Family History  Problem Relation Age of Onset   Stroke Mother    Hypertension Father     Social History Social History   Tobacco Use     Smoking status: Current Every Day Smoker    Packs/day: 0.50    Years: 15.00    Pack years: 7.50    Types: Cigarettes   Smokeless tobacco: Never Used  Substance Use Topics   Alcohol use: No    Frequency: Never   Drug use: Yes    Frequency: 7.0 times per week    Types: Marijuana     Allergies   Patient has no known allergies.   Review of Systems Review of Systems  Constitutional: Negative for chills and fever.  Respiratory: Negative for shortness of breath.   Gastrointestinal: Negative for nausea and vomiting.  Musculoskeletal: Positive for back pain and myalgias.  Neurological: Negative for weakness and numbness.     Physical Exam Updated Vital Signs BP (!) 140/96 (BP Location: Right Arm)    Pulse 71    Temp 98.8 F (37.1 C) (Oral)    Resp 18    SpO2 100%   Physical Exam Vitals signs and nursing note reviewed.  Constitutional:      General: He is not in acute distress.    Appearance: He is well-developed. He is not diaphoretic.  HENT:     Head: Normocephalic and atraumatic.  Eyes:     General: No scleral icterus.  Conjunctiva/sclera: Conjunctivae normal.  Neck:     Musculoskeletal: Normal range of motion.  Cardiovascular:     Rate and Rhythm: Normal rate and regular rhythm.     Heart sounds: Normal heart sounds.  Pulmonary:     Effort: Pulmonary effort is normal. No respiratory distress.     Breath sounds: Normal breath sounds.  Musculoskeletal:        General: Tenderness present.       Back:     Comments: No midline spinal tenderness present in lumbar, thoracic or cervical spine. No step-off palpated. No visible bruising, edema or temperature change noted. No objective signs of numbness present. No saddle anesthesia. 2+ DP pulses bilaterally. Sensation intact to light touch. Strength 5/5 in bilateral lower extremities.  Skin:    Findings: No rash.  Neurological:     Mental Status: He is alert.      ED Treatments / Results  Labs (all labs  ordered are listed, but only abnormal results are displayed) Labs Reviewed - No data to display  EKG None  Radiology No results found.  Procedures Procedures (including critical care time)  Medications Ordered in ED Medications  lidocaine (LIDODERM) 5 % 1 patch (1 patch Transdermal Patch Applied 04/02/19 1119)  ketorolac (TORADOL) 30 MG/ML injection 30 mg (30 mg Intramuscular Given 04/02/19 1120)  acetaminophen (TYLENOL) tablet 650 mg (650 mg Oral Given 04/02/19 1119)     Initial Impression / Assessment and Plan / ED Course  I have reviewed the triage vital signs and the nursing notes.  Pertinent labs & imaging results that were available during my care of the patient were reviewed by me and considered in my medical decision making (see chart for details).       53 year old male presents to ED for lower back pain.  Notes history of similar symptoms in the past, including his visit from December 2019 when he presented to the ED.  States that the medications given at the time help for him.  He had lumbar x-ray done at the time which showed degenerative changes.  Believes that extensive walking at work flared up his symptoms.  On exam pain is reproducible to palpation of the paraspinal musculature of the lumbar spine.  No midline tenderness.  Full range of motion of hips bilaterally.  He remains ambulatory. Patient denies any concerning symptoms suggestive of cauda equina requiring urgent imaging at this time such as loss of sensation in the lower extremities, lower extremity weakness, loss of bowel or bladder control, saddle anesthesia, urinary retention, fever/chills, IVDU. Exam demonstrated no  weakness on exam today. No preceding injury or trauma to suggest acute fracture. Doubt pelvic or urinary pathology for patient's acute back pain, as patient denies urinary symptoms, has no CVA tenderness, history/pain not consistent with nephrolithiasis.  Suspect that symptoms are musculoskeletal in  nature.  Doubt AAA as cause of patient's back pain as patient lacks major risk factors, had no abdominal TTP, and has symmetric and intact distal pulses. Patient given strict return precautions for any symptoms indicating worsening neurologic function in the lower extremities.  Will treat with IM Toradol, Tylenol and lidocaine patch.  Will discharge home with steroid course and PCP follow-up.  Patient is hemodynamically stable, in NAD, and able to ambulate in the ED. Evaluation does not show pathology that would require ongoing emergent intervention or inpatient treatment. I explained the diagnosis to the patient. Pain has been managed and has no complaints prior to discharge. Patient is comfortable with  above plan and is stable for discharge at this time. All questions were answered prior to disposition. Strict return precautions for returning to the ED were discussed. Encouraged follow up with PCP.   An After Visit Summary was printed and given to the patient.   Portions of this note were generated with [employer]. Dictation errors may occur despite best attempts at proofreading.   Final Clinical Impressions(s) / ED Diagnoses   Final diagnoses:  Strain of lumbar region, initial encounter    ED Discharge Orders         Ordered    predniSONE (STERAPRED UNI-PAK 21 TAB) 10 MG (21) TBPK tablet  Daily     04/02/19 Sandy, Glenford Garis, PA-C 04/02/19 1123    Hayden Rasmussen, MD 04/02/19 1705

## 2019-06-04 ENCOUNTER — Other Ambulatory Visit: Payer: Self-pay

## 2019-06-04 ENCOUNTER — Emergency Department (HOSPITAL_COMMUNITY)
Admission: EM | Admit: 2019-06-04 | Discharge: 2019-06-05 | Disposition: A | Discharge status: Home / Self Care | Payer: Self-pay | Attending: Emergency Medicine | Admitting: Emergency Medicine

## 2019-06-04 ENCOUNTER — Encounter (HOSPITAL_COMMUNITY): Payer: Self-pay | Admitting: Obstetrics and Gynecology

## 2019-06-04 DIAGNOSIS — F1120 Opioid dependence, uncomplicated: Secondary | ICD-10-CM | POA: Insufficient documentation

## 2019-06-04 DIAGNOSIS — F331 Major depressive disorder, recurrent, moderate: Secondary | ICD-10-CM | POA: Insufficient documentation

## 2019-06-04 DIAGNOSIS — F1410 Cocaine abuse, uncomplicated: Secondary | ICD-10-CM

## 2019-06-04 DIAGNOSIS — R45851 Suicidal ideations: Secondary | ICD-10-CM

## 2019-06-04 HISTORY — DX: Cocaine abuse, uncomplicated: F14.10

## 2019-06-04 LAB — COMPREHENSIVE METABOLIC PANEL
ALT: 16 U/L (ref 0–44)
AST: 17 U/L (ref 15–41)
Albumin: 3 g/dL — ABNORMAL LOW (ref 3.5–5.0)
Alkaline Phosphatase: 47 U/L (ref 38–126)
Anion gap: 6 (ref 5–15)
BUN: 10 mg/dL (ref 6–20)
CO2: 26 mmol/L (ref 22–32)
Calcium: 8.3 mg/dL — ABNORMAL LOW (ref 8.9–10.3)
Chloride: 110 mmol/L (ref 98–111)
Creatinine, Ser: 1.25 mg/dL — ABNORMAL HIGH (ref 0.61–1.24)
GFR calc Af Amer: >60 mL/min (ref >60)
GFR calc non Af Amer: >60 mL/min (ref >60)
Glucose, Bld: 102 mg/dL — ABNORMAL HIGH (ref 70–99)
Potassium: 3.9 mmol/L (ref 3.5–5.1)
Sodium: 142 mmol/L (ref 135–145)
Total Bilirubin: 0.5 mg/dL (ref 0.3–1.2)
Total Protein: 5.8 g/dL — ABNORMAL LOW (ref 6.5–8.1)

## 2019-06-04 LAB — CBC
HCT: 44.6 % (ref 39.0–52.0)
Hemoglobin: 14.1 g/dL (ref 13.0–17.0)
MCH: 27.6 pg (ref 26.0–34.0)
MCHC: 31.6 g/dL (ref 30.0–36.0)
MCV: 87.5 fL (ref 80.0–100.0)
Platelets: 274 10*3/uL (ref 150–400)
RBC: 5.1 MIL/uL (ref 4.22–5.81)
RDW: 15.4 % (ref 11.5–15.5)
WBC: 5.3 10*3/uL (ref 4.0–10.5)
nRBC: 0 % (ref 0.0–0.2)

## 2019-06-04 LAB — ACETAMINOPHEN LEVEL: Acetaminophen (Tylenol), Serum: <10 ug/mL — ABNORMAL LOW (ref 10–30)

## 2019-06-04 LAB — SALICYLATE LEVEL: Salicylate Lvl: <7 mg/dL (ref 2.8–30.0)

## 2019-06-04 LAB — ETHANOL: Alcohol, Ethyl (B): <10 mg/dL (ref <10)

## 2019-06-04 NOTE — ED Triage Notes (Signed)
Pt reports to the ED with SI, with a plan to jump off a bridge. Patient reports he has been using crack cocaine and has used over 200$ worth in the last 48 hours. Patient states he plans to jump off a bridge to end his life. Patient reports he does not have a support system. Patient reports he lives with his wife and she is disappointed in him.  Patient reports he is currently working at [employer].

## 2019-06-04 NOTE — ED Provider Notes (Signed)
[facility] Provider Note   CSN: [id number] Arrival date & time: 06/04/19  1504     History   Chief Complaint Chief Complaint  Patient presents with  . Drug Problem  . Suicidal    HPI Thomas Reeves is a 53 y.o. male with no significant past medical history who presents to the ED after contemplating suicide for bridge today.  He states that he has been depressed with intermittent suicidal ideation going on for 5 years since he began smoking crack cocaine.  He does not see a PCP as he did not have insurance.  On evaluation, patient has no medical complaints aside from his depression and suicidal ideation.  Yesterday he used all $200 of his spare cash to purchase and smoke cocaine which prompted his motivation to commit suicide today.  While at the bridge, he fortunately the decision to seek medical attention instead of jump.  He has a loving wife who he lives with who does not use any drugs and is supportive.  No other support system in place.  He has a good job at [employer] working 6 days a week.  He denies any homicidal ideation, alcohol use, or AVH.  He also denies any fevers, chills, recent notes, chest pain, diaphoresis, shortness of breath, abdominal discomfort, nausea, vomiting, or any other symptoms.    HPI  Past Medical History:  Diagnosis Date  . Cocaine abuse (HCC)     There are no active problems to display for this patient.   History reviewed. No pertinent surgical history.      Home Medications    Prior to Admission medications   Medication Sig Start Date End Date Taking? Authorizing Provider  cyclobenzaprine (FLEXERIL) 10 MG tablet Take 1 tablet (10 mg total) by mouth 2 (two) times daily as needed for muscle spasms. 08/07/18   McDonald, Mia A, PA-C  hydrocortisone cream 1 % Apply to affected area 2 times daily 05/21/12   Loren Racer, MD  predniSONE (STERAPRED UNI-PAK 21 TAB) 10 MG (21) TBPK tablet Take by mouth daily.  6 tabs by mouth daily  for 2 day, then 5 tabs for 2 day, then 4 tabs for 2 day, then 3 tabs for 2 day, 2 tabs for 2 day, then 1 tab by mouth daily for 2 day 04/02/19   Dietrich Pates, PA-C    Family History Family History  Problem Relation Age of Onset  . Stroke Mother   . Hypertension Father     Social History Social History   Tobacco Use  . Smoking status: Current Every Day Smoker    Packs/day: 0.50    Years: 15.00    Pack years: 7.50    Types: Cigarettes  . Smokeless tobacco: Never Used  Substance Use Topics  . Alcohol use: No    Frequency: Never  . Drug use: Yes    Frequency: 7.0 times per week    Types: Marijuana, Cocaine     Allergies   Patient has no known allergies.   Review of Systems Review of Systems  All other systems reviewed and are negative.    Physical Exam Updated Vital Signs BP 138/87 (BP Location: Left Arm)   Pulse 75   Temp 98.3 F (36.8 C) (Oral)   Resp 16   SpO2 100%   Physical Exam Vitals signs and nursing note reviewed. Exam conducted with a chaperone present.  Constitutional:      Appearance: Normal appearance.  HENT:  Head: Normocephalic and atraumatic.  Eyes:     General: No scleral icterus.    Conjunctiva/sclera: Conjunctivae normal.  Cardiovascular:     Rate and Rhythm: Normal rate and regular rhythm.     Pulses: Normal pulses.     Heart sounds: Normal heart sounds.  Pulmonary:     Effort: Pulmonary effort is normal. No respiratory distress.     Breath sounds: Normal breath sounds.  Abdominal:     General: Abdomen is flat. There is no distension.     Palpations: Abdomen is soft.     Tenderness: There is no abdominal tenderness. There is no guarding.  Skin:    General: Skin is dry.  Neurological:     Mental Status: He is alert.     GCS: GCS eye subscore is 4. GCS verbal subscore is 5. GCS motor subscore is 6.  Psychiatric:        Behavior: Behavior normal.        Thought Content: Thought content normal.      Comments: Teary-eyed on exam. Morose.       ED Treatments / Results  Labs (all labs ordered are listed, but only abnormal results are displayed) Labs Reviewed  COMPREHENSIVE METABOLIC PANEL - Abnormal; Notable for the following components:      Result Value   Glucose, Bld 102 (*)    Creatinine, Ser 1.25 (*)    Calcium 8.3 (*)    Total Protein 5.8 (*)    Albumin 3.0 (*)    All other components within normal limits  ACETAMINOPHEN LEVEL - Abnormal; Notable for the following components:   Acetaminophen (Tylenol), Serum <10 (*)    All other components within normal limits  ETHANOL  SALICYLATE LEVEL  CBC  RAPID URINE DRUG SCREEN, HOSP PERFORMED    EKG None  Radiology No results found.  Procedures Procedures (including critical care time)  Medications Ordered in ED Medications - No data to display   Initial Impression / Assessment and Plan / ED Course  I have reviewed the triage vital signs and the nursing notes.  Pertinent labs & imaging results that were available during my care of the patient were reviewed by me and considered in my medical decision making (see chart for details).       He has no complaints at this time.  Lab work was reassuring.  Patient is medically cleared and awaiting evaluation by TTS to determine disposition.   Final Clinical Impressions(s) / ED Diagnoses   Final diagnoses:  Suicidal ideation    ED Discharge Orders    None       Corena Herter, PA-C 06/04/19 1901    Nat Christen, MD 06/05/19 1722

## 2019-06-05 DIAGNOSIS — F1410 Cocaine abuse, uncomplicated: Secondary | ICD-10-CM

## 2019-06-05 NOTE — ED Notes (Signed)
[phone] "Thomas Reeves patient's wife

## 2019-06-05 NOTE — ED Notes (Signed)
On the phone 

## 2019-06-05 NOTE — ED Notes (Signed)
Pt declined to do EKG, peer support into see.

## 2019-06-05 NOTE — BH Assessment (Addendum)
[facility] Assessment Progress Note  Per Letitia Libra, FNP, this pt does not require psychiatric hospitalization at this time.  Pt presents under IVC initiated by Nat Christen, MD, which Hampton Abbot, MD has rescinded.  Pt is to be discharged from [facility] with recommendation to follow up with [facility].  This has been included in pt's discharge instructions.  Pt would also benefit from seeing Peer Support Specialists; they will be asked to speak to pt.  Pt's nurse, Narda Rutherford, has been notified.  Jalene Mullet, [place] Triage Specialist [phone]

## 2019-06-05 NOTE — [facility] Suicide Risk Assessment (Cosign Needed)
Suicide Risk Assessment  Discharge Assessment   [facility] Discharge Suicide Risk Assessment   Principal Problem: Cocaine use disorder [facility]) Discharge Diagnoses: Principal Problem:   Cocaine use disorder ([place])   Total Time spent with patient: 20 minutes  Musculoskeletal: Strength & Muscle Tone: within normal limits Gait & Station: normal Patient leans: N/A  Psychiatric Specialty Exam:   Blood pressure (!) 146/92, pulse 70, temperature 98.3 F (36.8 C), temperature source Oral, resp. rate 18, SpO2 100 %.There is no height or weight on file to calculate BMI.  General Appearance: Casual  Eye Contact::  Good  Speech:  Clear and Coherent409  Volume:  Normal  Mood:  Euthymic  Affect:  Appropriate  Thought Process:  Coherent and Descriptions of Associations: Intact  Orientation:  Full (Time, Place, and Person)  Thought Content:  WDL and Logical  Suicidal Thoughts:  No  Homicidal Thoughts:  No  Memory:  Immediate;   Good Recent;   Good Remote;   Good  Judgement:  Good  Insight:  Good    Psychomotor Activity:  Normal  Concentration:  Good  Recall:  Good  Fund of Knowledge:Good  Language: Good  Akathisia:  NA  Handed:  Right  AIMS (if indicated):     Assets:  Communication Skills Desire for Improvement Financial Resources/Insurance Housing Social Support  Sleep:     Cognition: WNL  ADL's:  Intact   Mental Status Per Nursing Assessment::   On Admission:   Patient states "I made a mistake, I came here because I wanted to get help with my cocaine use, I have been doing this (cocaine) for a long time and I just want to get off this mess." Patient reports "I feel bad after I use and I have spent all my rent money and stuff like that." Patient reports feelings of guilt related to crack cocaine use. Patient denies suicidal and homicidal thoughts. Patient denies hallucinations. Patient denies history of self-harm. Patient lives at home with his wife, patient denies access to weapons.   Collateral collected from patient's wife who denies any concern for patient's safety.   Demographic Factors:  Male  Loss Factors: NA  Historical Factors: NA  Risk Reduction Factors:   Sense of responsibility to family, Living with another person, especially a relative and Positive social support  Continued Clinical Symptoms:  Alcohol/Substance Abuse/Dependencies  Cognitive Features That Contribute To Risk:  None    Suicide Risk:  Minimal: No identifiable suicidal ideation.  Patients presenting with no risk factors but with morbid ruminations; may be classified as minimal risk based on the severity of the depressive symptoms    Plan Of Care/Follow-up recommendations: Patient for discharge home, plan to follow-up with outpatient psychiatry and substance use treatment.   Emmaline Kluver, FNP 06/05/2019, 11:02 AM

## 2019-06-05 NOTE — ED Notes (Signed)
Patient went to the bathroom and patient was informed that we needed a specimen. Patient stated, "I got one yesterday." patient also told that we needed an EKG. Patient refused.

## 2019-06-05 NOTE — ED Notes (Signed)
Per [name], IVC has been rescinded, pt to be dc'd

## 2019-06-05 NOTE — ED Notes (Signed)
Pt ambulatory w/o difficulty to room 34, wanded PTA

## 2019-06-05 NOTE — ED Notes (Addendum)
Written dc instructions reviewed w/ pt.  Pt encouraged to follow up with OP resources provided, and to seek treatment/return for changes or concerns, pt verbalized understanding.  Pt ambulatory w/o difficulty to dc area w/ NT, belongings returned after leaving the area.

## 2019-06-05 NOTE — ED Notes (Signed)
eval by [name] NP via telepsych..the patient reports that he is here because he was trying to get help with his drug problem.  Pt denies si/hi/avh at this time, and reports that he was not suicidal yesterday.  Pt reports that he tried to explain to the mobile crisis people.  Pt reports that he lives with his wife and feels safe there and wants to go home.

## 2019-06-05 NOTE — ED Notes (Signed)
TTS in progress.  Pt reports that he needs help with his drug problem.  Pt denies wanting to kill his self.  Pt reports that he "felt bad when his high went away...thoughts of it when he is coming off his high."  Pt also reports that he reported that last night because..."figured the way I can get help was to say it...no I'm not going to harm myself." Pt reports he uses crack and last used 10/13, and was in rehab in the early 1980's.

## 2019-06-05 NOTE — BH Assessment (Addendum)
Tele Assessment Note   Patient Name: Thomas Reeves MRN: [id number] Referring Physician: ED Physician Location of Patient: Thomas Reeves Location of Provider: [place] Tesch is an 53 y.o. male. Pt denies SI/HI/AVH. Per Pt he said he was suicidal because heard that what he needed to say in order to get SA treatment. The Pt states he feels bad after he uses but he does not want to harm himself. Pt denies previous SI attempts. Per Pt he has been addicted to crack cocaine for over 20 years and he has not been able to get help. The Pt states he is desperate for help.   Collateral contact from Pt's wife Thomas Reeves. Per Thomas Reeves the Pt did state he was suicidal in order to get help but he does not want to harm himself. Thomas Reeves does not have any concerns about the Pt's wellbeing. Thomas Reeves  states that she urges the Pt to get help.   Thomas Kluver, NP recommends D/C.  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 Cocaine abuse ([place])     History reviewed. No pertinent surgical history.  Family History:  Family History  Problem Relation Age of Onset  . Stroke Mother   . Hypertension Father     Social History:  reports that he has been smoking cigarettes. He has a 7.50 pack-year smoking history. He has never used smokeless tobacco. He reports current drug use. Frequency: 7.00 times per week. Drugs: Marijuana and Cocaine. He reports that he does not drink alcohol.  Additional Social History:  Alcohol / Drug Use Pain Medications: please see mar Prescriptions: please see mar Over the Counter: please see mar History of alcohol / drug use?: Yes Substance #1 Name of Substance 1: crack cocaine 1 - Age of First Use: unknown 1 - Amount (size/oz): unknown 1 - Frequency: unknown 1 - Duration: unknown 1 - Last Use / Amount: 10/13  CIWA: CIWA-Ar BP: (!) 146/92 Pulse Rate: 70 COWS:    Allergies: No Known Allergies  Home  Medications: (Not in a hospital admission)   OB/GYN Status:  No LMP for male patient.  General Assessment Data Location of Assessment: [facility] TTS Assessment: In system Is this a Tele or Face-to-Face Assessment?: Tele Assessment Is this an Initial Assessment or a Re-assessment for this encounter?: Initial Assessment Patient Accompanied by:: N/A Language Other than English: No Living Arrangements: Other (Comment) What gender do you identify as?: Male Marital status: Married [place] name: NA Pregnancy Status: No Living Arrangements: Spouse/significant other Can pt return to current living arrangement?: Yes Admission Status: Involuntary Is patient capable of signing voluntary admission?: Yes Referral Source: Self/Family/Friend Insurance type: SP     Crisis Care Plan Living Arrangements: Spouse/significant other Legal Guardian: Other:(self) Name of Psychiatrist: NA Name of Therapist: NA  Education Status Is patient currently in school?: No Is the patient employed, unemployed or receiving disability?: Unemployed  Risk to self with the past 6 months Suicidal Ideation: No-Not Currently/Within Last 6 Months Has patient been a risk to self within the past 6 months prior to admission? : No Suicidal Intent: No Has patient had any suicidal intent within the past 6 months prior to admission? : No Is patient at risk for suicide?: No Suicidal Plan?: No Has patient had any suicidal plan within the past 6 months prior to admission? : No Access to Means: No What has been your use of drugs/alcohol within the  last 12 months?: NA Previous Attempts/Gestures: No How many times?: 0 Other Self Harm Risks: NA Triggers for Past Attempts: None known Intentional Self Injurious Behavior: None Family Suicide History: No Recent stressful life event(s): Other (Comment)(SA) Persecutory voices/beliefs?: No Depression: Yes Depression Symptoms: (pt denies) Substance abuse history and/or treatment for  substance abuse?: No Suicide prevention information given to non-admitted patients: Not applicable  Risk to Others within the past 6 months Homicidal Ideation: No Does patient have any lifetime risk of violence toward others beyond the six months prior to admission? : No Thoughts of Harm to Others: No Current Homicidal Intent: No Current Homicidal Plan: No Access to Homicidal Means: No Identified Victim: NA History of harm to others?: No Assessment of Violence: None Noted Violent Behavior Description: NA Does patient have access to weapons?: No Criminal Charges Pending?: No Does patient have a court date: No Is patient on probation?: No  Psychosis Hallucinations: None noted Delusions: None noted  Mental Status Report Appearance/Hygiene: Unremarkable Eye Contact: Fair Motor Activity: Freedom of movement Speech: Logical/coherent Level of Consciousness: Alert Mood: Anxious Affect: Anxious Anxiety Level: Minimal Thought Processes: Coherent, Relevant Judgement: Unimpaired Orientation: Person, Place, Time, Situation Obsessive Compulsive Thoughts/Behaviors: None  Cognitive Functioning Concentration: Normal Memory: Recent Intact, Remote Intact Is patient IDD: No Insight: Fair Impulse Control: Fair Appetite: Fair Have you had any weight changes? : No Change Sleep: No Change Total Hours of Sleep: 8 Vegetative Symptoms: None  ADLScreening [facility] Assessment Services) Patient's cognitive ability adequate to safely complete daily activities?: Yes Patient able to express need for assistance with ADLs?: Yes Independently performs ADLs?: Yes (appropriate for developmental age)  Prior Inpatient Therapy Prior Inpatient Therapy: No  Prior Outpatient Therapy Prior Outpatient Therapy: No Does patient have an ACCT team?: No Does patient have Intensive In-House Services?  : No Does patient have [facility] services? : No Does patient have P4CC services?: No  ADL Screening (condition  at time of admission) Patient's cognitive ability adequate to safely complete daily activities?: Yes Is the patient deaf or have difficulty hearing?: No Does the patient have difficulty seeing, even when wearing glasses/contacts?: No Does the patient have difficulty concentrating, remembering, or making decisions?: No Patient able to express need for assistance with ADLs?: Yes Does the patient have difficulty dressing or bathing?: No Independently performs ADLs?: Yes (appropriate for developmental age)       Abuse/Neglect Assessment (Assessment to be complete while patient is alone) Abuse/Neglect Assessment Can Be Completed: Yes Physical Abuse: Denies Verbal Abuse: Denies Sexual Abuse: Denies Exploitation of patient/patient's resources: Denies     Merchant [employer] officer (For Healthcare) Does Patient Have a Medical Advance Directive?: No Would patient like information on creating a medical advance directive?: No - Patient declined          Disposition:  Disposition Initial Assessment Completed for this Encounter: Yes  This service was provided via telemedicine using a 2-way, interactive audio and video technology.  Names of all persons participating in this telemedicine service and their role in this encounter. Name: Kathie Rhodes Role: Wife  Name:  Role:  Name:  Role:   Name:  Role:     Emmit Pomfret 06/05/2019 9:44 AM

## 2019-06-05 NOTE — ED Notes (Signed)
TTS attempting assessment

## 2019-06-05 NOTE — ED Notes (Signed)
Vitals delayed due to patient is up for discharge and awaiting peer support

## 2019-06-05 NOTE — ED Notes (Signed)
Pt has been asleep all night, resting quietly. He rang the call bell out this morning saying he "has not had anything to eat since being here" I reminded the patient that he was given a sandwich last night, and a breakfast tray was ordered for him this morning. Pt cursing and yelling, threatening to leave. Went to room to talk with pt he told me "get the fuck out of my face". Pt then asked me why he could not leave, discussed with him that he is currently IVC and cannot leave. Continuing to curse and yell.

## 2019-06-05 NOTE — Discharge Instructions (Signed)
For your behavioral health needs you are advised to follow up with [facility].  New patients are seen at their walk-in clinic.  Walk-in hours are Monday - Friday from 8:30 am - 12:00 pm, and from 1:00 pm - 2:30 pm.  Walk-in patients are seen on a first come, first served basis, so try to arrive as early as possible for the best chance of being seen the same day:       [facility]      [address]      [place], [place] [address]      [phone] 

## 2020-09-02 IMAGING — CR DG LUMBAR SPINE COMPLETE 4+V
5 series · 5 of 5 positions shown · non-contrast
Comparison: 06/29/2008

CLINICAL DATA: Low back pain radiating down right leg.

EXAM:
LUMBAR SPINE - COMPLETE 4+ VIEW

[t lumbar spine ap]
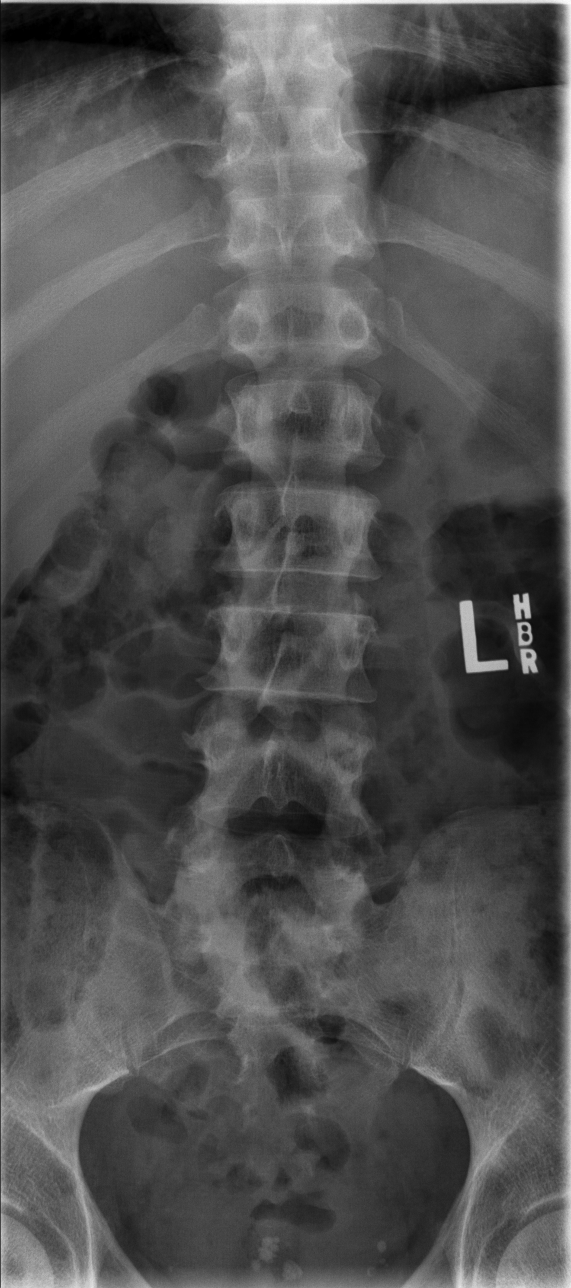

[t lumbar spine obl (1 of 2)]
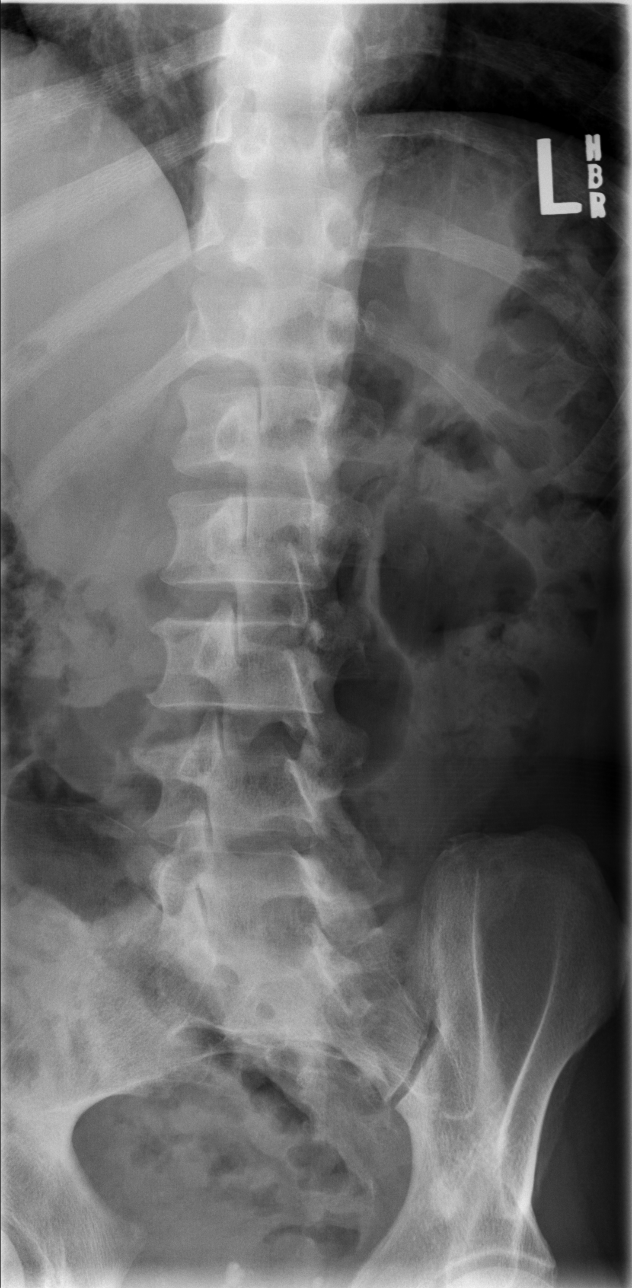

[t lumbar spine obl (2 of 2)]
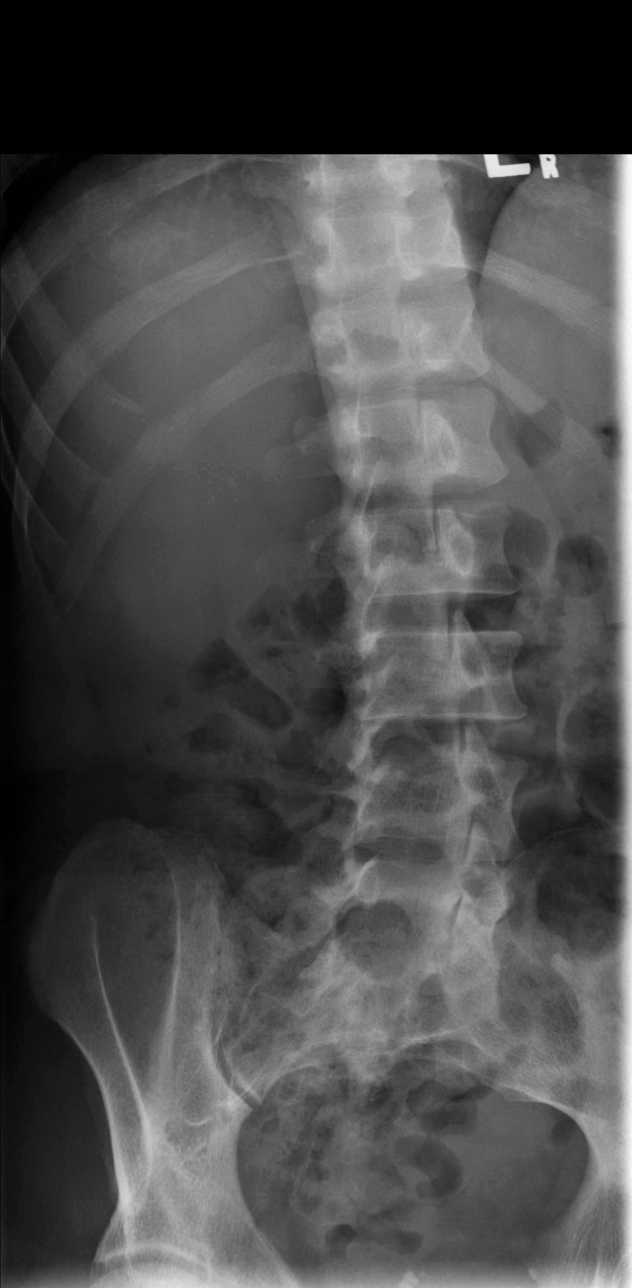

[t lumbar spine lat]
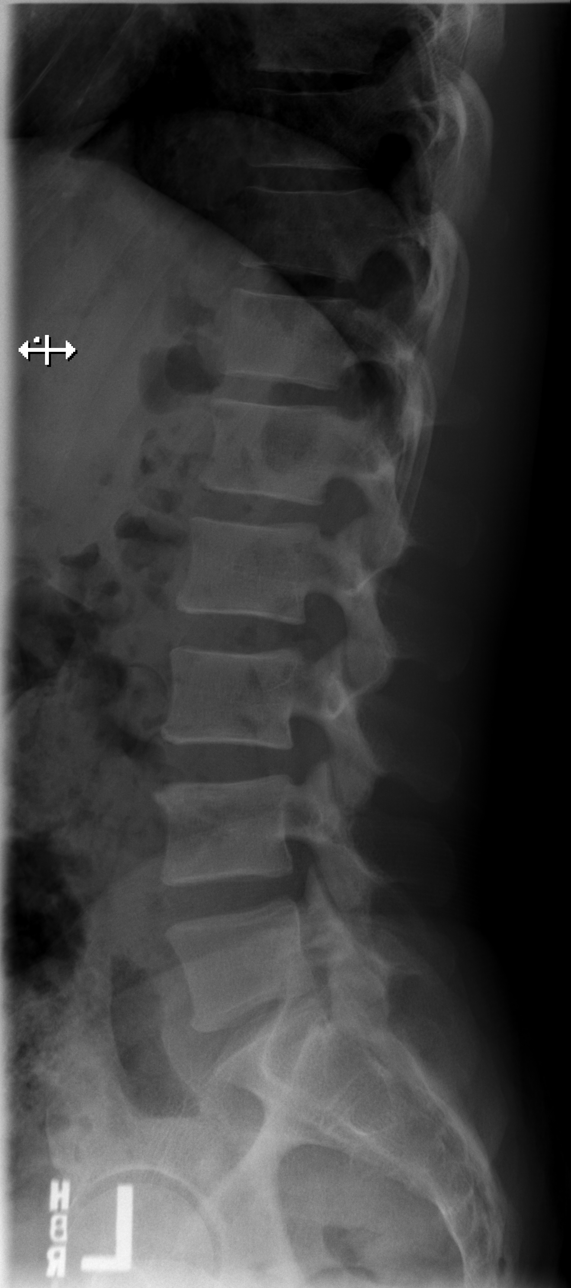

[t lumbar l-5 s-1 spot]
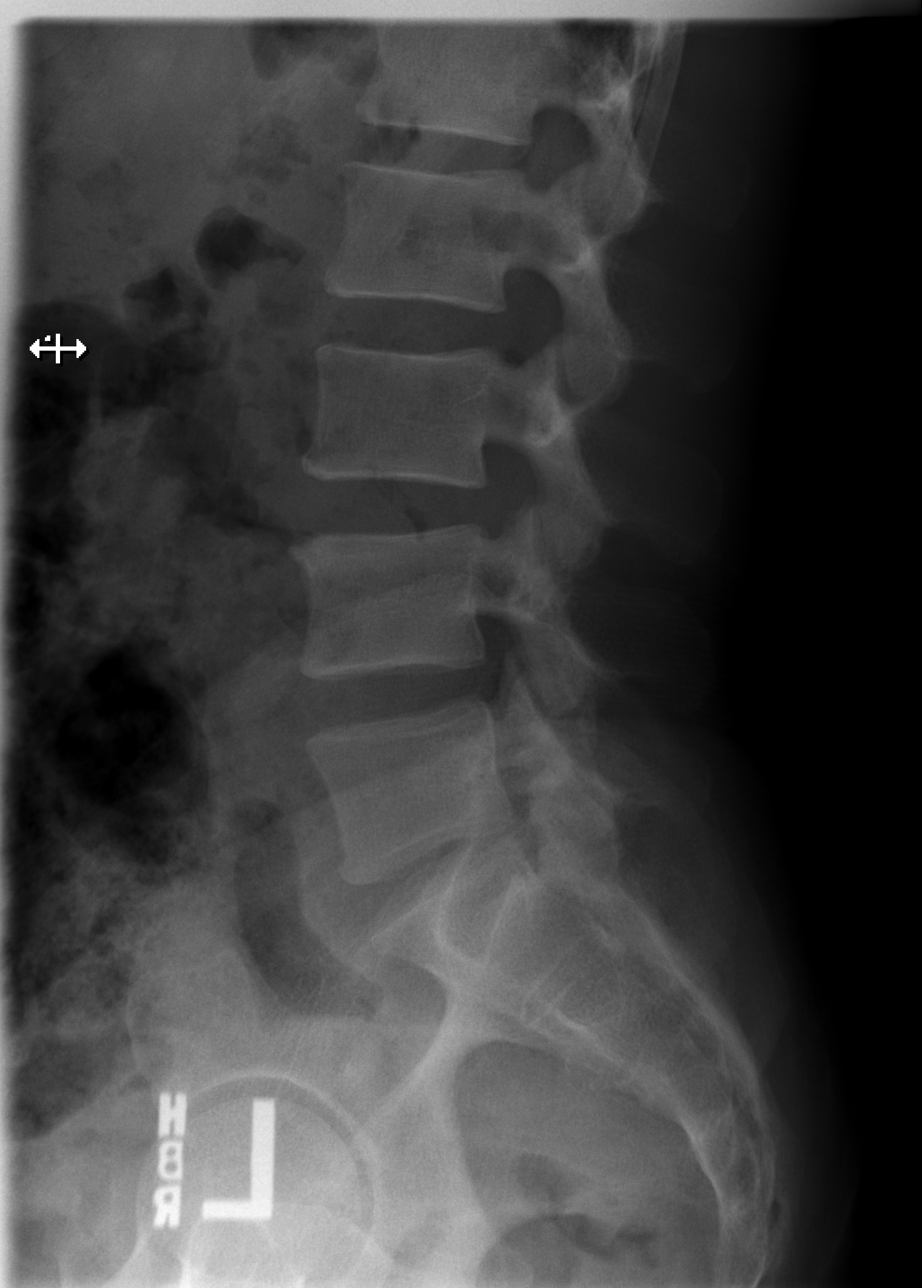

[5 of 5 positions shown; findings below may reference images not displayed]

FINDINGS: Normal alignment of the lumbar spine. Negative for a pars defect.
Vertebral body heights are maintained. Sclerosis and endplate
changes along the superior endplate of L4 are likely degenerative.
Mild disc space narrowing at L5-S1. The other disc spaces are
maintained.
IMPRESSION: No acute abnormality.  Mild degenerative disease as described.
